# Patient Record
Sex: Female | Born: 2000 | Race: Black or African American | Hispanic: No | Marital: Single
Health system: Midwestern US, Community
[De-identification: ages and names within clinical notes are randomized; demographics above are authoritative.]

## PROBLEM LIST (undated history)

## (undated) DIAGNOSIS — J45909 Unspecified asthma, uncomplicated: Secondary | ICD-10-CM

---
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
## 2020-09-06 ENCOUNTER — Emergency Department (HOSPITAL_COMMUNITY)
Admission: EM | Admit: 2020-09-06 | Discharge: 2020-09-06 | Disposition: A | Discharge status: Home / Self Care | Payer: Medicaid Other | Attending: Emergency Medicine | Admitting: Emergency Medicine

## 2020-09-06 ENCOUNTER — Encounter (HOSPITAL_COMMUNITY): Payer: Self-pay | Admitting: Emergency Medicine

## 2020-09-06 DIAGNOSIS — M25512 Pain in left shoulder: Secondary | ICD-10-CM | POA: Insufficient documentation

## 2020-09-06 DIAGNOSIS — R0781 Pleurodynia: Secondary | ICD-10-CM | POA: Diagnosis not present

## 2020-09-06 DIAGNOSIS — J45909 Unspecified asthma, uncomplicated: Secondary | ICD-10-CM | POA: Diagnosis not present

## 2020-09-06 DIAGNOSIS — Y92410 Unspecified street and highway as the place of occurrence of the external cause: Secondary | ICD-10-CM | POA: Insufficient documentation

## 2020-09-06 DIAGNOSIS — M542 Cervicalgia: Secondary | ICD-10-CM | POA: Diagnosis not present

## 2020-09-06 DIAGNOSIS — M546 Pain in thoracic spine: Secondary | ICD-10-CM | POA: Insufficient documentation

## 2020-09-06 DIAGNOSIS — M79622 Pain in left upper arm: Secondary | ICD-10-CM | POA: Insufficient documentation

## 2020-09-06 MED ORDER — IBUPROFEN 400 MG PO TABS: 400.0000 mg | ORAL_TABLET | Freq: Four times a day (QID) | ORAL | 0 refills | Status: AC | PRN

## 2020-09-06 MED ORDER — METHOCARBAMOL 500 MG PO TABS: 500.0000 mg | ORAL_TABLET | Freq: Two times a day (BID) | ORAL | 0 refills | Status: AC
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
## 2020-09-07 ENCOUNTER — Other Ambulatory Visit: Payer: Self-pay

## 2020-09-07 ENCOUNTER — Encounter (HOSPITAL_COMMUNITY): Payer: Self-pay | Admitting: Emergency Medicine

## 2020-09-07 ENCOUNTER — Emergency Department (HOSPITAL_COMMUNITY): Payer: No Typology Code available for payment source

## 2020-09-07 ENCOUNTER — Emergency Department (HOSPITAL_COMMUNITY)
Admission: EM | Admit: 2020-09-07 | Discharge: 2020-09-07 | Disposition: A | Discharge status: Home / Self Care | Payer: No Typology Code available for payment source | Attending: Emergency Medicine | Admitting: Emergency Medicine

## 2020-09-07 ENCOUNTER — Encounter (HOSPITAL_COMMUNITY): Payer: Self-pay

## 2020-09-07 ENCOUNTER — Ambulatory Visit (HOSPITAL_COMMUNITY)
Admission: EM | Admit: 2020-09-07 | Discharge: 2020-09-07 | Disposition: A | Discharge status: Home / Self Care | Payer: Medicaid Other

## 2020-09-07 DIAGNOSIS — R0789 Other chest pain: Secondary | ICD-10-CM | POA: Insufficient documentation

## 2020-09-07 DIAGNOSIS — M25512 Pain in left shoulder: Secondary | ICD-10-CM

## 2020-09-07 DIAGNOSIS — M542 Cervicalgia: Secondary | ICD-10-CM | POA: Insufficient documentation

## 2020-09-07 DIAGNOSIS — M791 Myalgia, unspecified site: Secondary | ICD-10-CM | POA: Insufficient documentation

## 2020-09-07 DIAGNOSIS — M549 Dorsalgia, unspecified: Secondary | ICD-10-CM

## 2020-09-07 LAB — CBC WITH DIFFERENTIAL/PLATELET
Abs Immature Granulocytes: 0 10*3/uL (ref 0.00–0.07)
Basophils Absolute: 0 10*3/uL (ref 0.0–0.1)
Basophils Relative: 1 %
Eosinophils Absolute: 0.1 10*3/uL (ref 0.0–0.5)
Eosinophils Relative: 2 %
HCT: 36.7 % (ref 36.0–46.0)
Hemoglobin: 11.6 g/dL — ABNORMAL LOW (ref 12.0–15.0)
Immature Granulocytes: 0 %
Lymphocytes Relative: 51 %
Lymphs Abs: 2 10*3/uL (ref 0.7–4.0)
MCH: 27.1 pg (ref 26.0–34.0)
MCHC: 31.6 g/dL (ref 30.0–36.0)
MCV: 85.7 fL (ref 80.0–100.0)
Monocytes Absolute: 0.4 10*3/uL (ref 0.1–1.0)
Monocytes Relative: 10 %
Neutro Abs: 1.4 10*3/uL — ABNORMAL LOW (ref 1.7–7.7)
Neutrophils Relative %: 36 %
Platelets: 391 10*3/uL (ref 150–400)
RBC: 4.28 MIL/uL (ref 3.87–5.11)
RDW: 14 % (ref 11.5–15.5)
WBC: 3.9 10*3/uL — ABNORMAL LOW (ref 4.0–10.5)
nRBC: 0 % (ref 0.0–0.2)

## 2020-09-07 LAB — BASIC METABOLIC PANEL
Anion gap: 8 (ref 5–15)
BUN: 12 mg/dL (ref 6–20)
CO2: 25 mmol/L (ref 22–32)
Calcium: 8.9 mg/dL (ref 8.9–10.3)
Chloride: 107 mmol/L (ref 98–111)
Creatinine, Ser: 0.92 mg/dL (ref 0.44–1.00)
GFR, Estimated: >60 mL/min (ref >60)
Glucose, Bld: 103 mg/dL — ABNORMAL HIGH (ref 70–99)
Potassium: 3.9 mmol/L (ref 3.5–5.1)
Sodium: 140 mmol/L (ref 135–145)
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
## 2020-09-07 NOTE — ED Notes (Signed)
Discharge instructions provided to patient. Verbalized understanding. Alert and oriented. Ambulated with steady gait out of ED. 
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
## 2020-09-07 NOTE — ED Notes (Signed)
Patient eating in [place] at this time.  Encouraged not to but family got patient food from vending machine
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
## 2021-02-01 ENCOUNTER — Emergency Department (HOSPITAL_COMMUNITY)
Admission: EM | Admit: 2021-02-01 | Discharge: 2021-02-01 | Disposition: A | Discharge status: Home / Self Care | Payer: Medicaid Other | Attending: Emergency Medicine | Admitting: Emergency Medicine

## 2021-02-01 ENCOUNTER — Encounter (HOSPITAL_COMMUNITY): Payer: Self-pay | Admitting: Emergency Medicine

## 2021-02-01 ENCOUNTER — Other Ambulatory Visit: Payer: Self-pay

## 2021-02-01 DIAGNOSIS — L509 Urticaria, unspecified: Secondary | ICD-10-CM | POA: Insufficient documentation

## 2021-02-01 DIAGNOSIS — J45909 Unspecified asthma, uncomplicated: Secondary | ICD-10-CM | POA: Insufficient documentation

## 2021-02-01 DIAGNOSIS — R21 Rash and other nonspecific skin eruption: Secondary | ICD-10-CM | POA: Diagnosis present

## 2021-02-01 MED ORDER — FAMOTIDINE 20 MG PO TABS
20.0000 mg | ORAL_TABLET | Freq: Once | ORAL | Status: AC
  Administered 2021-02-01: 20 mg via ORAL
  Filled 2021-02-01: qty 1

## 2021-02-01 MED ORDER — DIPHENHYDRAMINE HCL 25 MG PO CAPS
50.0000 mg | ORAL_CAPSULE | Freq: Once | ORAL | Status: AC
  Administered 2021-02-01: 50 mg via ORAL
  Filled 2021-02-01: qty 2

## 2021-02-01 MED ORDER — FAMOTIDINE 20 MG PO TABS: 20.0000 mg | ORAL_TABLET | Freq: Two times a day (BID) | ORAL | 0 refills | Status: AC

## 2021-02-01 MED ORDER — PREDNISONE 20 MG PO TABS
60.0000 mg | ORAL_TABLET | Freq: Once | ORAL | Status: AC
  Administered 2021-02-01: 60 mg via ORAL
  Filled 2021-02-01: qty 3

## 2021-02-01 MED ORDER — DIPHENHYDRAMINE HCL 25 MG PO TABS: 25.0000 mg | ORAL_TABLET | Freq: Four times a day (QID) | ORAL | 0 refills | Status: AC | PRN

## 2021-02-01 NOTE — ED Provider Notes (Signed)
[facility] EMERGENCY DEPARTMENT Provider Note   CSN: [id number] Arrival date & time: 02/01/21  1115     History No chief complaint on file.   Mary Jones is a 20 y.o. female.  The history is provided by the patient. No language interpreter was used.     20 year old female without any history of drug allergies presenting complaining of itchy rash.  Patient states last night she went out to a seafood restaurant to eat with her friends.  She was eating she noticed itchiness throughout her body.  The itchiness became progressively worse throughout the night and subsequently she noticed hives appearing on the skin.  She took one 25 mg Benadryl and went to sleep.  This morning she still endorse having hives and itchiness thus prompting this ER visit.  She denies any tongue swelling, trouble swallowing, wheezing, chest pain, trouble breathing, abdominal cramping, or lightheadedness.  She denies any other environmental changes no new pets no new medication or detergent.  Past Medical History:  Diagnosis Date  . Asthma     There are no problems to display for this patient.   History reviewed. No pertinent surgical history.   OB History   No obstetric history on file.     Family History  Problem Relation Age of Onset  . Healthy Mother   . Healthy Father     Social History   Tobacco Use  . Smoking status: Never Smoker  . Smokeless tobacco: Never Used  Vaping Use  . Vaping Use: Never used  Substance Use Topics  . Alcohol use: Yes  . Drug use: Never    Home Medications Prior to Admission medications   Medication Sig Start Date End Date Taking? Authorizing Provider  ibuprofen (ADVIL) 400 MG tablet Take 1 tablet (400 mg total) by mouth every 6 (six) hours as needed for mild pain or moderate pain. 09/06/20   Haskel Schroeder, PA-C  methocarbamol (ROBAXIN) 500 MG tablet Take 1 tablet (500 mg total) by mouth 2 (two) times daily. 09/06/20   Haskel Schroeder, PA-C    Allergies    Patient has no known allergies.  Review of Systems   Review of Systems  All other systems reviewed and are negative.   Physical Exam Updated Vital Signs BP 117/75 (BP Location: Right Arm)   Pulse 81   Temp 98.2 F (36.8 C) (Oral)   Resp 16   SpO2 99%   Physical Exam Vitals and nursing note reviewed.  Constitutional:      General: She is not in acute distress.    Appearance: She is well-developed.  HENT:     Head: Atraumatic.     Mouth/Throat:     Mouth: Mucous membranes are moist.     Pharynx: No oropharyngeal exudate or posterior oropharyngeal erythema.  Eyes:     Conjunctiva/sclera: Conjunctivae normal.  Cardiovascular:     Rate and Rhythm: Normal rate and regular rhythm.     Pulses: Normal pulses.     Heart sounds: Normal heart sounds.  Pulmonary:     Effort: Pulmonary effort is normal.     Breath sounds: Normal breath sounds. No wheezing.  Abdominal:     Palpations: Abdomen is soft.     Tenderness: There is no abdominal tenderness.  Musculoskeletal:        General: Normal range of motion.     Cervical back: Neck supple.  Skin:    Findings: Rash (Faint urticarial rash noted to upper  and lower extremities without signs of infection) present.  Neurological:     Mental Status: She is alert and oriented to person, place, and time.  Psychiatric:        Mood and Affect: Mood normal.     ED Results / Procedures / Treatments   Labs (all labs ordered are listed, but only abnormal results are displayed) Labs Reviewed - No data to display  EKG None  Radiology No results found.  Procedures Procedures   Medications Ordered in ED Medications - No data to display  ED Course  I have reviewed the triage vital signs and the nursing notes.  Pertinent labs & imaging results that were available during my care of the patient were reviewed by me and considered in my medical decision making (see chart for details).    MDM  Rules/Calculators/A&P                          BP 109/76 (BP Location: Right Arm)   Pulse 77   Temp 97.8 F (36.6 C) (Tympanic)   Resp 16   Ht 5\' 3"  (1.6 m)   Wt 65.8 kg   SpO2 100%   BMI 25.69 kg/m   Final Clinical Impression(s) / ED Diagnoses Final diagnoses:  Urticaria    Rx / DC Orders ED Discharge Orders         Ordered    diphenhydrAMINE (BENADRYL) 25 MG tablet  Every 6 hours PRN        02/01/21 1228    famotidine (PEPCID) 20 MG tablet  2 times daily        02/01/21 1228         12:16 PM Patient developed urticarial rash after eating seafood.  This is consistent with an allergy.  No evidence of anaphylactic reaction.  Will provide symptomatic treatment.   02/03/21, PA-C 02/01/21 1230    02/03/21, MD 02/01/21 1250

## 2021-02-01 NOTE — Discharge Instructions (Addendum)
Your hives are likely due to exposure to seafood.  Please take Benadryl and Pepcid as prescribed.  Follow-up with an allergist for further evaluation.  If you develop throat swelling, trouble breathing, severe abdominal cramping please return to the ER for further care.

## 2021-02-01 NOTE — ED Triage Notes (Signed)
Pt here after breaking out in hives last night after eating seafood , no hx of same , pt took benadryl which helped , nad in triage
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
## 2022-10-21 ENCOUNTER — Ambulatory Visit (HOSPITAL_COMMUNITY)
Admission: EM | Admit: 2022-10-21 | Discharge: 2022-10-21 | Disposition: A | Discharge status: Home / Self Care | Payer: Medicaid Other | Attending: Physician Assistant | Admitting: Physician Assistant

## 2022-10-21 ENCOUNTER — Encounter (HOSPITAL_COMMUNITY): Payer: Self-pay | Admitting: *Deleted

## 2022-10-21 ENCOUNTER — Other Ambulatory Visit: Payer: Self-pay

## 2022-10-21 DIAGNOSIS — J069 Acute upper respiratory infection, unspecified: Secondary | ICD-10-CM | POA: Diagnosis not present

## 2022-10-21 DIAGNOSIS — R040 Epistaxis: Secondary | ICD-10-CM

## 2022-10-21 MED ORDER — OXYMETAZOLINE HCL 0.05 % NA SOLN
NASAL | Status: AC
  Filled 2022-10-21: qty 30

## 2022-10-21 MED ORDER — OXYMETAZOLINE HCL 0.05 % NA SOLN: 1.0000 | Freq: Two times a day (BID) | NASAL | Status: DC

## 2022-10-21 NOTE — ED Triage Notes (Signed)
Pt reports nose bleed just before arrival to [facility]. Pt reports her nose was bleeding fo r5-7 mins.

## 2022-10-21 NOTE — Discharge Instructions (Signed)
Advised to use the Afrin nasal spray, 2 sprays in the affected nostril 3 times a day for 3 days only and then stop.  Doing this should help the irritated nasal membranes to heal and prevent any further nosebleeds.  Advised follow-up PCP or return to urgent care as needed.

## 2022-10-21 NOTE — ED Provider Notes (Signed)
[facility]    CSN: [id number] Arrival date & time: 10/21/22  1752      History   Chief Complaint Chief Complaint  Patient presents with   Epistaxis    HPI Mary Jones is a 22 y.o. female.   22 year old female presents with nosebleed.  Patient indicates for the past several days she has had some upper respiratory congestion to where she had considerable sinus congestion, postnasal drip and rhinitis.  She relates she was blowing her nose quite frequently to try to clear out the congestion and fairly forceful.  Patient indicates most of the congestion resolved over the past 2 days.  She indicates today she suddenly started having a nosebleed that occurred in the left nostril.  She relates the bleeding was profuse and indicates that it lasted for several minutes before she is able to get it stopped.  Patient indicates she has not had any fever, chills, nausea or vomiting.  Patient relates that she is feeling well except for the nosebleed, she indicates having normal appetite and fluid intake over the past couple days.  Patient without dizziness, headache, vision changes.   Epistaxis   Past Medical History:  Diagnosis Date   Asthma     There are no problems to display for this patient.   History reviewed. No pertinent surgical history.  OB History   No obstetric history on file.      Home Medications    Prior to Admission medications   Medication Sig Start Date End Date Taking? Authorizing Provider  diphenhydrAMINE (BENADRYL) 25 MG tablet Take 1 tablet (25 mg total) by mouth every 6 (six) hours as needed. 02/01/21   Fayrene Helper, PA-C  famotidine (PEPCID) 20 MG tablet Take 1 tablet (20 mg total) by mouth 2 (two) times daily. 02/01/21   Fayrene Helper, PA-C  ibuprofen (ADVIL) 400 MG tablet Take 1 tablet (400 mg total) by mouth every 6 (six) hours as needed for mild pain or moderate pain. 09/06/20   Haskel Schroeder, PA-C  methocarbamol (ROBAXIN) 500 MG tablet  Take 1 tablet (500 mg total) by mouth 2 (two) times daily. 09/06/20   Haskel Schroeder, PA-C    Family History Family History  Problem Relation Age of Onset   Healthy Mother    Healthy Father     Social History Social History   Tobacco Use   Smoking status: Never   Smokeless tobacco: Never  Vaping Use   Vaping Use: Never used  Substance Use Topics   Alcohol use: Yes   Drug use: Never     Allergies   Patient has no known allergies.   Review of Systems Review of Systems  HENT:  Positive for nosebleeds (left nostril).      Physical Exam Triage Vital Signs ED Triage Vitals  Enc Vitals Group     BP 10/21/22 1817 121/78     Pulse Rate 10/21/22 1817 98     Resp 10/21/22 1817 18     Temp 10/21/22 1817 98.5 F (36.9 C)     Temp src --      SpO2 10/21/22 1817 95 %     Weight --      Height --      Head Circumference --      Peak Flow --      Pain Score 10/21/22 1813 0     Pain Loc --      Pain Edu? --      Excl. in  GC? --    No data found.  Updated Vital Signs BP 121/78   Pulse 98   Temp 98.5 F (36.9 C)   Resp 18   LMP 10/21/2022   SpO2 95%   Visual Acuity Right Eye Distance:   Left Eye Distance:   Bilateral Distance:    Right Eye Near:   Left Eye Near:    Bilateral Near:     Physical Exam Constitutional:      Appearance: Normal appearance.  HENT:     Right Ear: Tympanic membrane and ear canal normal.     Left Ear: Tympanic membrane and ear canal normal.     Nose:      Comments: Nose: Left nostril with edematous turbinates and visual small blood clot present in the upper part of the nose. Right nostril has mild edematous present. There are single nose piercings present on each nostril.    Mouth/Throat:     Mouth: Mucous membranes are moist.     Pharynx: Oropharynx is clear.  Cardiovascular:     Rate and Rhythm: Normal rate and regular rhythm.     Heart sounds: Normal heart sounds.  Pulmonary:     Effort: Pulmonary effort is  normal.     Breath sounds: Normal breath sounds and air entry. No wheezing, rhonchi or rales.  Lymphadenopathy:     Cervical: No cervical adenopathy.  Neurological:     Mental Status: She is alert.      UC Treatments / Results  Labs (all labs ordered are listed, but only abnormal results are displayed) Labs Reviewed - No data to display  EKG   Radiology No results found.  Procedures Procedures (including critical care time)  Medications Ordered in UC Medications  oxymetazoline (AFRIN) 0.05 % nasal spray 1 spray (has no administration in time range)    Initial Impression / Assessment and Plan / UC Course  I have reviewed the triage vital signs and the nursing notes.  Pertinent labs & imaging results that were available during my care of the patient were reviewed by me and considered in my medical decision making (see chart for details).    Plan: 1.  The upper respiratory tract infection will be treated with the following: A.  Patient advised to use Afrin nasal spray, 2 sprays in each nostril 3 times a day for 3 days only and then stop as this will help decrease the congestion and prevent nosebleed. 2.  The epi taxis will be treated with the following: A.  Patient advised to use Afrin nasal spray, 2 sprays each nostril 3 times a day for 3 days only and then stop as this will help decrease the congestion and prevent the nosebleed from occurring. 3.  Patient advised to follow-up with PCP or return to urgent care as needed. Final Clinical Impressions(s) / UC Diagnoses   Final diagnoses:  Epistaxis  Viral upper respiratory tract infection     Discharge Instructions      Advised to use the Afrin nasal spray, 2 sprays in the affected nostril 3 times a day for 3 days only and then stop.  Doing this should help the irritated nasal membranes to heal and prevent any further nosebleeds.  Advised follow-up PCP or return to urgent care as needed.    ED Prescriptions    None    PDMP not reviewed this encounter.   Nyoka Lint, PA-C 10/21/22 [phone]
